# Patient Record
Sex: Male | Born: 2000 | Race: Black or African American | Hispanic: No | Marital: Single | Smoking: Never smoker
Health system: Southern US, Community
[De-identification: ages and names within clinical notes are randomized; demographics above are authoritative.]

## PROBLEM LIST (undated history)

## (undated) DIAGNOSIS — S7290XA Unspecified fracture of unspecified femur, initial encounter for closed fracture: Secondary | ICD-10-CM

## (undated) DIAGNOSIS — J352 Hypertrophy of adenoids: Secondary | ICD-10-CM

## (undated) HISTORY — DX: Hypertrophy of adenoids: J35.2

---
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
## 2019-03-26 ENCOUNTER — Other Ambulatory Visit: Payer: Self-pay

## 2019-03-26 ENCOUNTER — Emergency Department (HOSPITAL_COMMUNITY)
Admission: EM | Admit: 2019-03-26 | Discharge: 2019-03-27 | Discharge status: Against Medical Advice | Payer: Medicaid Other | Attending: Emergency Medicine | Admitting: Emergency Medicine

## 2019-03-26 ENCOUNTER — Encounter (HOSPITAL_COMMUNITY): Payer: Self-pay

## 2019-03-26 DIAGNOSIS — R109 Unspecified abdominal pain: Secondary | ICD-10-CM | POA: Diagnosis present

## 2019-03-26 DIAGNOSIS — Z5321 Procedure and treatment not carried out due to patient leaving prior to being seen by health care provider: Secondary | ICD-10-CM | POA: Diagnosis not present

## 2019-03-26 NOTE — ED Triage Notes (Signed)
Onset several hours after eating pt vomited x 1 with intermittant abd cramping.  No nausea.  120/79

## 2019-03-26 NOTE — ED Notes (Signed)
Per Albin Felling at [facility] pt no longer in waiting room

## 2019-03-26 NOTE — ED Notes (Signed)
NO answer for treatment room 

## 2019-03-26 NOTE — ED Notes (Signed)
No answer in waiting room 

## 2019-08-12 ENCOUNTER — Emergency Department (HOSPITAL_COMMUNITY): Payer: Medicaid Other

## 2019-08-12 ENCOUNTER — Emergency Department (HOSPITAL_COMMUNITY)
Admission: EM | Admit: 2019-08-12 | Discharge: 2019-08-12 | Disposition: A | Discharge status: Home / Self Care | Payer: Medicaid Other | Attending: Emergency Medicine | Admitting: Emergency Medicine

## 2019-08-12 ENCOUNTER — Other Ambulatory Visit: Payer: Self-pay

## 2019-08-12 ENCOUNTER — Encounter (HOSPITAL_COMMUNITY): Payer: Self-pay | Admitting: *Deleted

## 2019-08-12 DIAGNOSIS — R112 Nausea with vomiting, unspecified: Secondary | ICD-10-CM | POA: Diagnosis present

## 2019-08-12 DIAGNOSIS — R0789 Other chest pain: Secondary | ICD-10-CM | POA: Diagnosis not present

## 2019-08-12 DIAGNOSIS — R1013 Epigastric pain: Secondary | ICD-10-CM | POA: Diagnosis not present

## 2019-08-12 LAB — CBC
HCT: 46.7 % (ref 39.0–52.0)
Hemoglobin: 15.6 g/dL (ref 13.0–17.0)
MCH: 29.9 pg (ref 26.0–34.0)
MCHC: 33.4 g/dL (ref 30.0–36.0)
MCV: 89.5 fL (ref 80.0–100.0)
Platelets: 182 10*3/uL (ref 150–400)
RBC: 5.22 MIL/uL (ref 4.22–5.81)
RDW: 13 % (ref 11.5–15.5)
WBC: 4.2 10*3/uL (ref 4.0–10.5)
nRBC: 0 % (ref 0.0–0.2)

## 2019-08-12 LAB — TROPONIN I (HIGH SENSITIVITY)
Troponin I (High Sensitivity): <2 ng/L (ref <18)
Troponin I (High Sensitivity): <2 ng/L (ref <18)

## 2019-08-12 MED ORDER — ONDANSETRON 4 MG PO TBDP: 4.0000 mg | ORAL_TABLET | Freq: Three times a day (TID) | ORAL | 0 refills | Status: DC | PRN

## 2019-08-12 MED ORDER — ONDANSETRON 4 MG PO TBDP
4.0000 mg | ORAL_TABLET | Freq: Once | ORAL | Status: AC
  Administered 2019-08-12: 4 mg via ORAL
  Filled 2019-08-12: qty 1

## 2019-08-12 MED ORDER — ALUM & MAG HYDROXIDE-SIMETH 200-200-20 MG/5ML PO SUSP
30.0000 mL | Freq: Once | ORAL | Status: AC
  Administered 2019-08-12: 30 mL via ORAL
  Filled 2019-08-12: qty 30

## 2019-08-12 MED ORDER — IBUPROFEN 400 MG PO TABS: 400.0000 mg | ORAL_TABLET | Freq: Four times a day (QID) | ORAL | 0 refills | Status: DC | PRN

## 2019-08-12 MED ORDER — FAMOTIDINE 20 MG PO TABS: 20.0000 mg | ORAL_TABLET | Freq: Two times a day (BID) | ORAL | 0 refills | Status: DC

## 2019-08-12 NOTE — ED Provider Notes (Signed)
[facility] EMERGENCY DEPARTMENT Provider Note   CSN: [id number] Arrival date & time: 08/12/19  0042     History   Chief Complaint Chief Complaint  Patient presents with  . Chest Pain    HPI Douglas Chambers is a 18 y.o. male presenting to the emergency department with complaint of nausea and vomiting that began 3 days ago.  He states he is having trouble keeping food down with this odes of nausea and vomiting after meals.  He states after he began having nausea and vomiting he began having some chest discomfort that he describes as an ache across his anterior chest and epigastric abdomen.  He feels as though his chest symptoms are not better or worse with any position changes.  He has treated his symptoms with Pepto-Bismol with mild relief.  He denies urinary symptoms, fever, diarrhea, constipation, cough, shortness of breath.  No known sick contacts.     The history is provided by the patient.    Past Medical History:  Diagnosis Date  . Adenoid hypertrophy   . Femur fracture [facility])     Patient Active Problem List   Diagnosis Date Noted  . Dizziness 12/16/2018  . Acute otitis media of right ear in pediatric patient 09/07/2018  . Need for prophylactic vaccination and inoculation against influenza 09/07/2018  . High risk sexual behavior in adolescent 07/12/2018  . Acute torn meniscus of knee, right, initial encounter 09/10/2017  . Pain in right femur 09/08/2017  . Acute pain of right knee 09/08/2017  . Aggressive behavior of adolescent 03/15/2017  . BMI (body mass index), pediatric, 5% to less than 85% for age 83/06/2015  . Femur fracture ([place]) 12/08/2014  . Closed fracture of right femur ([place]) 12/08/2014  . Knee injury 08/01/2014  . Body mass index, pediatric, 5th percentile to less than 85th percentile for age 83/05/2014  . Acne vulgaris 06/15/2014  . Well child check 06/13/2013  . BMI (body mass index), pediatric, 85% to less than 95% for age 83/02/2012     Past Surgical History:  Procedure Laterality Date  . INTRAMEDULLARY (IM) NAIL INTERTROCHANTERIC Right 12/08/2014   Procedure: AFFIXUS NAIL/ FEMUR FX;  Surgeon: Marybelle Killings, MD;  Location: [facility];  Service: Orthopedics;  Laterality: Right;        Home Medications    Prior to Admission medications   Medication Sig Start Date End Date Taking? Authorizing Provider  acetaminophen (TYLENOL) 325 MG tablet Take 2 tablets (650 mg total) by mouth every 6 (six) hours as needed. 02/04/18   Jean Rosenthal, NP  azithromycin (ZITHROMAX) 500 MG tablet Take 2 tablets (1,000 mg total) by mouth daily. 07/12/18   Kristen Loader, DO  cetirizine (ZYRTEC) 10 MG tablet Take 1 tablet (10 mg total) by mouth daily. 09/07/18   Marcha Solders, MD  famotidine (PEPCID) 20 MG tablet Take 1 tablet (20 mg total) by mouth 2 (two) times daily. 08/12/19   Robinson, [place] N, PA-C  fluticasone (FLONASE) 50 MCG/ACT nasal spray Place 1 spray into both nostrils daily. Patient not taking: Reported on 03/14/2017 12/30/15   Hermenia Bers, NP  ibuprofen (ADVIL) 400 MG tablet Take 1 tablet (400 mg total) by mouth every 6 (six) hours as needed for mild pain or moderate pain. 08/12/19   Robinson, [place] N, PA-C  loperamide (IMODIUM) 2 MG capsule Take 1 capsule (2 mg total) by mouth 4 (four) times daily as needed for diarrhea or loose stools. Patient not taking: Reported  on 09/08/2017 06/17/17   McDonald, Pedro Earls A, PA-C  mupirocin ointment (BACTROBAN) 2 % Apply 1 application topically 2 (two) times daily. Patient not taking: Reported on 03/14/2017 05/18/16   Gretchen Short, NP  ondansetron (ZOFRAN ODT) 4 MG disintegrating tablet Take 1 tablet (4 mg total) by mouth every 8 (eight) hours as needed for nausea or vomiting. 08/12/19   Robinson, [place] N, PA-C  ondansetron (ZOFRAN) 4 MG tablet Take 1 tablet (4 mg total) by mouth 2 (two) times daily. Patient not taking: Reported on 03/14/2017 12/30/15   Gretchen Short, NP  polyethylene  glycol (MIRALAX / GLYCOLAX) packet Take 17 g by mouth daily. Patient not taking: Reported on 09/08/2017 06/09/17   Sherrilee Gilles, NP  polyethylene glycol [facility]) packet Take 17 g by mouth daily. Patient not taking: Reported on 03/14/2017 05/18/16   Gretchen Short, NP    Family History Family History  Problem Relation Age of Onset  . Hypertension Maternal Grandmother   . Diabetes Maternal Grandfather   . Hypertension Maternal Grandfather   . Alcohol abuse Neg Hx   . Arthritis Neg Hx   . Asthma Neg Hx   . Birth defects Neg Hx   . Cancer Neg Hx   . COPD Neg Hx   . Depression Neg Hx   . Drug abuse Neg Hx   . Hearing loss Neg Hx   . Early death Neg Hx   . Heart disease Neg Hx   . Hyperlipidemia Neg Hx   . Kidney disease Neg Hx   . Learning disabilities Neg Hx   . Mental illness Neg Hx   . Mental retardation Neg Hx   . Miscarriages / Stillbirths Neg Hx   . Stroke Neg Hx   . Vision loss Neg Hx     Social History Social History   Tobacco Use  . Smoking status: Never Smoker  . Smokeless tobacco: Never Used  Substance Use Topics  . Alcohol use: No  . Drug use: No     Allergies   Patient has no known allergies.   Review of Systems Review of Systems  Constitutional: Negative for fever.  Gastrointestinal: Positive for abdominal pain, nausea and vomiting.  All other systems reviewed and are negative.    Physical Exam Updated Vital Signs BP 133/75   Pulse 79   Temp 98.8 F (37.1 C) (Oral)   Resp 18   Ht 6' (1.829 m)   Wt 81.6 kg   SpO2 99%   BMI 24.41 kg/m   Physical Exam Vitals signs and nursing note reviewed.  Constitutional:      General: He is not in acute distress.    Appearance: He is well-developed. He is not ill-appearing.     Comments: Appears to be resting comfortably in bed, laying flat on his back.  HENT:     Head: Normocephalic and atraumatic.  Eyes:     Conjunctiva/sclera: Conjunctivae normal.  Cardiovascular:     Rate and Rhythm:  Normal rate and regular rhythm.  Pulmonary:     Effort: Pulmonary effort is normal. No respiratory distress.     Breath sounds: Normal breath sounds.  Chest:     Chest wall: Tenderness (Generalized anterior chest wall tenderness to palpation.) present.  Abdominal:     General: Bowel sounds are normal.     Palpations: Abdomen is soft.     Tenderness: There is abdominal tenderness in the epigastric area. There is no guarding or rebound.  Skin:  General: Skin is warm.  Neurological:     Mental Status: He is alert.  Psychiatric:        Behavior: Behavior normal.      ED Treatments / Results  Labs (all labs ordered are listed, but only abnormal results are displayed) Labs Reviewed  CBC  TROPONIN I (HIGH SENSITIVITY)  TROPONIN I (HIGH SENSITIVITY)    EKG EKG Interpretation  Date/Time:  Saturday August 12 2019 01:29:14 EDT Ventricular Rate:  58 PR Interval:  156 QRS Duration: 88 QT Interval:  400 QTC Calculation: 392 R Axis:   71 Text Interpretation:  Sinus bradycardia with sinus arrhythmia Early repolarization Otherwise normal ECG No previous ECGs available Confirmed by Zadie RhineWickline, Donald ([id number]) on 08/12/2019 1:35:57 AM   Radiology Dg Chest 2 View  Result Date: 08/12/2019 CLINICAL DATA:  Chest pain EXAM: CHEST - 2 VIEW COMPARISON:  12/08/2014 FINDINGS: The heart size and mediastinal contours are within normal limits. Both lungs are clear. The visualized skeletal structures are unremarkable. Metallic opacity over the right upper chest, no change IMPRESSION: No active cardiopulmonary disease. Electronically Signed   By: Jasmine PangKim  Fujinaga M.D.   On: 08/12/2019 02:20    Procedures Procedures (including critical care time)  Medications Ordered in ED Medications  ondansetron (ZOFRAN-ODT) disintegrating tablet 4 mg (4 mg Oral Given 08/12/19 [id number])  alum & mag hydroxide-simeth (MAALOX/MYLANTA) 200-200-20 MG/5ML suspension 30 mL (30 mLs Oral Given 08/12/19 [id number])     Initial  Impression / Assessment and Plan / ED Course  I have reviewed the triage vital signs and the nursing notes.  Pertinent labs & imaging results that were available during my care of the patient were reviewed by me and considered in my medical decision making (see chart for details).        Patient presenting with nausea and vomiting x3 days followed by anterior chest ache which is reproducible on exam.  Had labs done in triage which reveal normal white count, negative troponin, negative chest x-ray.Marland Kitchen.  He is in no acute distress, afebrile with stable vital signs.  Abdominal exam without peritoneal signs.  Low suspicion for emergent/surgical intra-abdominal pathology.   Treated symptomatically and patient is tolerating p.o. fluids. Chest pain is reproducible, likely musculoskeletal after episodes of emesis. Discussed EKG with Dr. Jeraldine LootsLockwood. Symptoms do not seem consistent with pericarditis, heart sounds normal, pt laying flat on his back in bed without distress. Will d/c with symptomatic management for N/V and instruct NSAIDs  For pain. Return for any worsening sx or if not improvement.   Discussed results, findings, treatment and follow up. Patient advised of return precautions. Patient verbalized understanding and agreed with plan.  Final Clinical Impressions(s) / ED Diagnoses   Final diagnoses:  Non-intractable vomiting with nausea, unspecified vomiting type  Chest wall pain    ED Discharge Orders         Ordered    ondansetron (ZOFRAN ODT) 4 MG disintegrating tablet  Every 8 hours PRN     08/12/19 0932    famotidine (PEPCID) 20 MG tablet  2 times daily     08/12/19 0932    ibuprofen (ADVIL) 400 MG tablet  Every 6 hours PRN     08/12/19 0932           Robinson, [place] N, PA-C 08/12/19 [id number]    Gerhard MunchLockwood, Robert, MD 08/13/19 [phone]

## 2019-08-12 NOTE — ED Triage Notes (Signed)
The ptis c/o chest pain vomiting for one week no temp   No distress

## 2019-08-12 NOTE — Discharge Instructions (Signed)
Please read instructions below. Drink clear liquids until your stomach feels better. Then, slowly introduce bland foods into your diet as tolerated, such as bread, rice, apples, bananas. You can take zofran every 8 hours as needed for nausea. You can take pepcid every 12 hours to help with acid in your stomach. Take ibuprofen every 6 hours for pain for at least the next 48 hours. Follow up with your primary care if symptoms persist. Return to the ER if no improvement, severe abdominal pain, fever, uncontrollable vomiting, or new or concerning symptoms.

## 2019-08-12 NOTE — ED Notes (Signed)
Pt given ginger ale.

## 2019-08-15 ENCOUNTER — Telehealth: Payer: Medicaid Other | Admitting: Nurse Practitioner

## 2019-08-15 DIAGNOSIS — R112 Nausea with vomiting, unspecified: Secondary | ICD-10-CM

## 2019-08-15 NOTE — Progress Notes (Signed)
Based on what you shared with me it looks like you have nausea and vomiting,that should be evaluated in a face to face office visit. According to your chart you went vo the ER on 08/12/19 with the same complaint and was given zofran. Because this has not resolved since then you will need further testing doine which I cannot do in an evisit. You will need to see your PCP or go to the urgent care.    NOTE: If you entered your credit card information for this eVisit, you will not be charged. You may see a "hold" on your card for the $35 but that hold will drop off and you will not have a charge processed.  If you are having a true medical emergency please call 911.     For an urgent face to face visit, [facility] has four urgent care centers for your convenience:   . [facility]    [phone]                  Get Driving Directions  [address], [place] [address] . 10 am to 8 pm Monday-Friday . 12 pm to 8 pm Saturday-Sunday   . [facility] [place]                  Get Driving Directions  [address], [place], [place] [address] . 8 am to 8 pm Monday-Friday . 9 am to 6 pm Saturday . 11 am to 6 pm Sunday   . [facility] at [place]                  Get Driving Directions   [address] [place], [place] [address] . 8 am to 8 pm Monday-Friday . 8 am to 4 pm Saturday-Sunday    . [facility] [place]                    Get Driving Directions  [phone]  [address], [place], [place] [address]  . Monday-Friday, 12 PM to 6 PM    Your e-visit answers were reviewed by a board certified advanced clinical practitioner to complete your personal care plan.  Thank you for using e-Visits.

## 2020-01-01 MED ORDER — ONDANSETRON 4 MG PO TBDP
4.00 | ORAL_TABLET | ORAL | Status: DC
Start: ? — End: 2020-01-01

## 2020-01-01 MED ORDER — LORAZEPAM 1 MG PO TABS
1.00 | ORAL_TABLET | ORAL | Status: DC
Start: ? — End: 2020-01-01

## 2020-01-01 MED ORDER — DEXTROSE IN LACTATED RINGERS 5 % IV SOLN
INTRAVENOUS | Status: DC
Start: ? — End: 2020-01-01

## 2020-01-01 MED ORDER — ALUM & MAG HYDROXIDE-SIMETH 200-200-20 MG/5ML PO SUSP
30.00 | ORAL | Status: DC
Start: ? — End: 2020-01-01

## 2020-02-13 ENCOUNTER — Ambulatory Visit (INDEPENDENT_AMBULATORY_CARE_PROVIDER_SITE_OTHER): Payer: Medicaid Other | Admitting: Pediatrics

## 2020-02-13 ENCOUNTER — Other Ambulatory Visit: Payer: Self-pay

## 2020-02-13 VITALS — Wt 170.7 lb

## 2020-02-13 DIAGNOSIS — B349 Viral infection, unspecified: Secondary | ICD-10-CM | POA: Diagnosis not present

## 2020-02-13 DIAGNOSIS — R52 Pain, unspecified: Secondary | ICD-10-CM | POA: Diagnosis not present

## 2020-02-13 NOTE — Progress Notes (Signed)
Subjective:    Douglas Chambers is a 19 y.o. old male here with his self for Fatigue and Emesis   HPI: Douglas Chambers presents with history of 3 days ago with vomiting and nausea. The first day it was x4 vomiting.  Haven't had any vomiting yesterday.  Started having some back pain, body aches and low energy.  Denies any fevers, diff breathing, wheezing, chills, loss taste and smell, HA, sore throat.  Today feeling a little better but still some body aches.  He is keeping fluids and some food down.     The following portions of the patient's history were reviewed and updated as appropriate: allergies, current medications, past family history, past medical history, past social history, past surgical history and problem list.  Review of Systems Pertinent items are noted in HPI.   Allergies: No Known Allergies   Current Outpatient Medications on File Prior to Visit  Medication Sig Dispense Refill  . acetaminophen (TYLENOL) 325 MG tablet Take 2 tablets (650 mg total) by mouth every 6 (six) hours as needed. 30 tablet 0  . azithromycin (ZITHROMAX) 500 MG tablet Take 2 tablets (1,000 mg total) by mouth daily. 2 tablet 0  . cetirizine (ZYRTEC) 10 MG tablet Take 1 tablet (10 mg total) by mouth daily. 30 tablet 2  . famotidine (PEPCID) 20 MG tablet Take 1 tablet (20 mg total) by mouth 2 (two) times daily. 20 tablet 0  . fluticasone (FLONASE) 50 MCG/ACT nasal spray Place 1 spray into both nostrils daily. (Patient not taking: Reported on 03/14/2017) 16 g 12  . ibuprofen (ADVIL) 400 MG tablet Take 1 tablet (400 mg total) by mouth every 6 (six) hours as needed for mild pain or moderate pain. 20 tablet 0  . loperamide (IMODIUM) 2 MG capsule Take 1 capsule (2 mg total) by mouth 4 (four) times daily as needed for diarrhea or loose stools. (Patient not taking: Reported on 09/08/2017) 12 capsule 0  . mupirocin ointment (BACTROBAN) 2 % Apply 1 application topically 2 (two) times daily. (Patient not taking: Reported on 03/14/2017)  22 g 0  . ondansetron (ZOFRAN ODT) 4 MG disintegrating tablet Take 1 tablet (4 mg total) by mouth every 8 (eight) hours as needed for nausea or vomiting. 20 tablet 0  . ondansetron (ZOFRAN) 4 MG tablet Take 1 tablet (4 mg total) by mouth 2 (two) times daily. (Patient not taking: Reported on 03/14/2017) 8 tablet 0  . polyethylene glycol (MIRALAX / GLYCOLAX) packet Take 17 g by mouth daily. (Patient not taking: Reported on 09/08/2017) 30 each 1  . polyethylene glycol (MIRALAX) packet Take 17 g by mouth daily. (Patient not taking: Reported on 03/14/2017) 14 each 0   No current facility-administered medications on file prior to visit.    History and Problem List: Past Medical History:  Diagnosis Date  . Adenoid hypertrophy   . Femur fracture (HCC)         Objective:    Wt 170 lb 11.2 oz (77.4 kg)   BMI 23.15 kg/m   General: alert, active, cooperative, non toxic ENT: oropharynx moist, no lesions, nares no discharge Eye:  PERRL, EOMI, conjunctivae clear, no discharge Ears: TM clear/intact bilateral, no discharge Neck: supple, no sig LAD Lungs: clear to auscultation, no wheeze, crackles or retractions Heart: RRR, Nl S1, S2, no murmurs Abd: soft, non tender, non distended, normal BS, no organomegaly, no masses appreciated, no guarding or rebound tenderness Skin: no rashes Neuro: normal mental status, No focal deficits  No results found  for this or any previous visit (from the past 41 hour(s)).     Assessment:   Douglas Chambers is a 19 y.o. old male with  1. Acute viral syndrome   2. Generalized body aches     Plan:   1.   --Normal progression of viral illness discussed. All questions answered.  --Avoid smoke exposure which can exacerbate and lengthened symptoms.  --Instruction given for use of nasal saline, cough drops and OTC's for symptomatic relief --Explained the rationale for symptomatic treatment rather than use of an antibiotic. --Rest and fluids  encouraged --Analgesics/Antipyretics as needed, dose reviewed. --Discuss worrisome symptoms to monitor for that would require evaluation. --Follow up as needed should symptoms fail to improve. --offered Covid test but declined, discussed could not rule out.   --Discussed possibility of symptoms being Covid19.  Given information for getting tested but results would not change quarantine recommendation.  Due to the symptoms being indistinguishable from other viral illness.  Recommendations are to quarantine for 10 days after symptom onset or positive test and at least 24hrs without fever or 10 days after Covid 19 positive exposure or 7 days after exposure with negative Covid19 test.  Discussed concerning symptoms that would need immediate evaluation like shortness of breath, chest pain or persistent symptoms.       No orders of the defined types were placed in this encounter.    Return if symptoms worsen or fail to improve. in 2-3 days or prior for concerns  Kristen Loader, DO

## 2020-02-16 ENCOUNTER — Encounter: Payer: Self-pay | Admitting: Pediatrics

## 2020-02-16 NOTE — Patient Instructions (Addendum)
Viral Illness, Adult Viruses are tiny germs that can get into a person's body and cause illness. There are many different types of viruses, and they cause many types of illness. Viral illnesses can range from mild to severe. They can affect various parts of the body. Common illnesses that are caused by a virus include colds and the flu. Viral illnesses also include serious conditions such as HIV/AIDS (human immunodeficiency virus/acquired immunodeficiency syndrome). A few viruses have been linked to certain cancers. What are the causes? Many types of viruses can cause illness. Viruses invade cells in your body, multiply, and cause the infected cells to malfunction or die. When the cell dies, it releases more of the virus. When this happens, you develop symptoms of the illness, and the virus continues to spread to other cells. If the virus takes over the function of the cell, it can cause the cell to divide and grow out of control, as is the case when a virus causes cancer. Different viruses get into the body in different ways. You can get a virus by:  Swallowing food or water that is contaminated with the virus.  Breathing in droplets that have been coughed or sneezed into the air by an infected person.  Touching a surface that has been contaminated with the virus and then touching your eyes, nose, or mouth.  Being bitten by an insect or animal that carries the virus.  Having sexual contact with a person who is infected with the virus.  Being exposed to blood or fluids that contain the virus, either through an open cut or during a transfusion. If a virus enters your body, your body's defense system (immune system) will try to fight the virus. You may be at higher risk for a viral illness if your immune system is weak. What are the signs or symptoms? Symptoms vary depending on the type of virus and the location of the cells that it invades. Common symptoms of the main types of viral illnesses  include: Cold and flu viruses  Fever.  Headache.  Sore throat.  Muscle aches.  Nasal congestion.  Cough. Digestive system (gastrointestinal) viruses  Fever.  Abdominal pain.  Nausea.  Diarrhea. Liver viruses (hepatitis)  Loss of appetite.  Tiredness.  Yellowing of the skin (jaundice). Brain and spinal cord viruses  Fever.  Headache.  Stiff neck.  Nausea and vomiting.  Confusion or sleepiness. Skin viruses  Warts.  Itching.  Rash. Sexually transmitted viruses  Discharge.  Swelling.  Redness.  Rash. How is this treated? Viruses can be difficult to treat because they live within cells. Antibiotic medicines do not treat viruses because these drugs do not get inside cells. Treatment for a viral illness may include:  Resting and drinking plenty of fluids.  Medicines to relieve symptoms. These can include over-the-counter medicine for pain and fever, medicines for cough or congestion, and medicines to relieve diarrhea.  Antiviral medicines. These drugs are available only for certain types of viruses. They may help reduce flu symptoms if taken early. There are also many antiviral medicines for hepatitis and HIV/AIDS. Some viral illnesses can be prevented with vaccinations. A common example is the flu shot. Follow these instructions at home: Medicines   Take over-the-counter and prescription medicines only as told by your health care provider.  If you were prescribed an antiviral medicine, take it as told by your health care provider. Do not stop taking the medicine even if you start to feel better.  Be aware of when   antibiotics are needed and when they are not needed. Antibiotics do not treat viruses. If your health care provider thinks that you may have a bacterial infection as well as a viral infection, you may get an antibiotic. ? Do not ask for an antibiotic prescription if you have been diagnosed with a viral illness. That will not make your  illness go away faster. ? Frequently taking antibiotics when they are not needed can lead to antibiotic resistance. When this develops, the medicine no longer works against the bacteria that it normally fights. General instructions  Drink enough fluids to keep your urine clear or pale yellow.  Rest as much as possible.  Return to your normal activities as told by your health care provider. Ask your health care provider what activities are safe for you.  Keep all follow-up visits as told by your health care provider. This is important. How is this prevented? Take these actions to reduce your risk of viral infection:  Eat a healthy diet and get enough rest.  Wash your hands often with soap and water. This is especially important when you are in public places. If soap and water are not available, use hand sanitizer.  Avoid close contact with friends and family who have a viral illness.  If you travel to areas where viral gastrointestinal infection is common, avoid drinking water or eating raw food.  Keep your immunizations up to date. Get a flu shot every year as told by your health care provider.  Do not share toothbrushes, nail clippers, razors, or needles with other people.  Always practice safe sex.  Contact a health care provider if:  You have symptoms of a viral illness that do not go away.  Your symptoms come back after going away.  Your symptoms get worse. Get help right away if:  You have trouble breathing.  You have a severe headache or a stiff neck.  You have severe vomiting or abdominal pain. This information is not intended to replace advice given to you by your health care provider. Make sure you discuss any questions you have with your health care provider. Document Revised: 10/08/2017 Document Reviewed: 03/06/2016 Elsevier Patient Education  2020 Else[employer] Inc.  

## 2020-02-19 ENCOUNTER — Ambulatory Visit: Payer: Medicaid Other

## 2020-02-20 ENCOUNTER — Other Ambulatory Visit: Payer: Self-pay

## 2020-02-20 ENCOUNTER — Ambulatory Visit (INDEPENDENT_AMBULATORY_CARE_PROVIDER_SITE_OTHER): Payer: Medicaid Other | Admitting: Pediatrics

## 2020-02-20 ENCOUNTER — Encounter: Payer: Self-pay | Admitting: Pediatrics

## 2020-02-20 VITALS — Temp 98.3°F | Wt 168.2 lb

## 2020-02-20 DIAGNOSIS — R112 Nausea with vomiting, unspecified: Secondary | ICD-10-CM | POA: Diagnosis not present

## 2020-02-20 DIAGNOSIS — R1084 Generalized abdominal pain: Secondary | ICD-10-CM | POA: Diagnosis not present

## 2020-02-20 MED ORDER — ONDANSETRON 4 MG PO TBDP: 4.0000 mg | ORAL_TABLET | Freq: Three times a day (TID) | ORAL | 0 refills | Status: DC | PRN

## 2020-02-20 MED ORDER — FAMOTIDINE 20 MG PO TABS: 20.0000 mg | ORAL_TABLET | Freq: Two times a day (BID) | ORAL | 0 refills | Status: AC

## 2020-02-20 NOTE — Patient Instructions (Signed)
Go to [email or web address] to sign up for appointment to have Covid tested.

## 2020-02-20 NOTE — Progress Notes (Signed)
Subjective:    Douglas Chambers is a 19 y.o. old male here with his self for Abdominal Pain and Emesis   HPI: Douglas Chambers presents with history of seen in office last week 4/6 with h/o nausea and vomiting for a couple days.  Was having some back pain and body aches and decreased energy.  He was seen in ER in February for similar symptoms.  Reported the N/V had went away and body aches.  Then stomach pain started 2 days ago and felt nauseous and vomited.  He layed down and vomited 1x more that afternoon and just layed down.  Was able to keep down some broth.  Yesterday was a little better w/o vomiting and feeling tired and just layed around.  He reports pain is on sides of stomach comes and goes and sharp feeling.  Denies any fevers, chills, cough, runny nose and congestion, ear pain, breathing issues, wheezing, diarrhea, body aches, constipation.   Reports that last time in 10/21 seen for abd pain and n/v tried pepcid and it was helpful.  Pain was similar back in ER visist in 2/21 and 10/20.     The following portions of the patient's history were reviewed and updated as appropriate: allergies, current medications, past family history, past medical history, past social history, past surgical history and problem list.  Review of Systems Pertinent items are noted in HPI.   Allergies: No Known Allergies  ++0 Current Outpatient Medications on File Prior to Visit  Medication Sig Dispense Refill  . acetaminophen (TYLENOL) 325 MG tablet Take 2 tablets (650 mg total) by mouth every 6 (six) hours as needed. 30 tablet 0  . azithromycin (ZITHROMAX) 500 MG tablet Take 2 tablets (1,000 mg total) by mouth daily. 2 tablet 0  . cetirizine (ZYRTEC) 10 MG tablet Take 1 tablet (10 mg total) by mouth daily. 30 tablet 2  . fluticasone (FLONASE) 50 MCG/ACT nasal spray Place 1 spray into both nostrils daily. (Patient not taking: Reported on 03/14/2017) 16 g 12   No current facility-administered medications on file prior to visit.     History and Problem List: Past Medical History:  Diagnosis Date  . Adenoid hypertrophy   . Femur fracture (HCC)         Objective:    Temp 98.3 F (36.8 C)   Wt 168 lb 4 oz (76.3 kg)   BMI 22.82 kg/m   General: alert, active, cooperative, non toxic ENT: oropharynx moist, OP clearno lesions, nares no discharge Eye:  PERRL, EOMI, conjunctivae clear, no discharge Ears: TM clear/intact bilateral, no discharge Neck: supple, no sig LAD Lungs: clear to auscultation, no wheeze, crackles or retractions Heart: RRR, Nl S1, S2, no murmurs Abd: soft, tender to deep palpation across abdomen and more bilateral mid abdomen, no rebound tenderness, no pain with percussion/foot slap, non distended, normal BS, no organomegaly, no masses appreciated Skin: no rashes Neuro: normal mental status, No focal deficits  No results found for this or any previous visit (from the past 72 hour(s)).     Assessment:   Douglas Chambers is a 19 y.o. old male with  1. Generalized abdominal pain   2. Non-intractable vomiting with nausea, unspecified vomiting type     Plan:+   1.  Offered Covid test in office but discussed PCR test may be better option further out from onset of symptoms and patient would rather just take one test and will make appointment.  Plan to get Covid PCR tested tomorrow and will sign up online  at [email or web address] to get tested.  Zofran for n/v q8hr.  Refill Pepcid which helped last episode.  Will refer to GI for his chronic history of recurrent abdominal pain, and n/v.  He has had multiple episodes of abdominal pain and nausea/vomiting with many ER visits in past 6 months.     Meds ordered this encounter  Medications  . ondansetron (ZOFRAN ODT) 4 MG disintegrating tablet    Sig: Take 1 tablet (4 mg total) by mouth every 8 (eight) hours as needed for nausea or vomiting.    Dispense:  20 tablet    Refill:  0  . famotidine (PEPCID) 20 MG tablet    Sig: Take 1 tablet (20 mg total)  by mouth 2 (two) times daily for 14 days.    Dispense:  30 tablet    Refill:  0     Return if symptoms worsen or fail to improve. in 2-3 days or prior for concerns  Kristen Loader, DO

## 2020-02-23 ENCOUNTER — Encounter: Payer: Self-pay | Admitting: Pediatrics

## 2020-02-23 NOTE — Addendum Note (Signed)
Addended by: Estevan Ryder on: 02/23/2020 09:36 AM   Modules accepted: Orders

## 2020-02-28 ENCOUNTER — Other Ambulatory Visit: Payer: Self-pay

## 2020-02-28 ENCOUNTER — Encounter (HOSPITAL_COMMUNITY): Payer: Self-pay

## 2020-02-28 ENCOUNTER — Ambulatory Visit (HOSPITAL_COMMUNITY)
Admission: EM | Admit: 2020-02-28 | Discharge: 2020-02-28 | Disposition: A | Discharge status: Home / Self Care | Payer: Medicaid Other | Attending: Internal Medicine | Admitting: Internal Medicine

## 2020-02-28 DIAGNOSIS — Z20822 Contact with and (suspected) exposure to covid-19: Secondary | ICD-10-CM | POA: Diagnosis not present

## 2020-02-28 NOTE — Discharge Instructions (Addendum)
Monitor MyChart for results °

## 2020-02-28 NOTE — ED Provider Notes (Signed)
[facility]    CSN: [id number] Arrival date & time: 02/28/20  1443      History   Chief Complaint Chief Complaint  Patient presents with  . Covid Exposure    HPI Douglas Chambers is a 19 y.o. male a significant past medical history presenting today for evaluation of Covid testing.  Patient had possible exposure at work and work is requiring testing prior to returning.  He denies any symptoms, denies any URI symptoms, GI symptoms.  Denies fevers chills or body aches.  HPI  Past Medical History:  Diagnosis Date  . Adenoid hypertrophy   . Femur fracture [facility])     Patient Active Problem List   Diagnosis Date Noted  . Dizziness 12/16/2018  . Acute otitis media of right ear in pediatric patient 09/07/2018  . Need for prophylactic vaccination and inoculation against influenza 09/07/2018  . High risk sexual behavior in adolescent 07/12/2018  . Acute torn meniscus of knee, right, initial encounter 09/10/2017  . Pain in right femur 09/08/2017  . Acute pain of right knee 09/08/2017  . Aggressive behavior of adolescent 03/15/2017  . BMI (body mass index), pediatric, 5% to less than 85% for age 77/06/2015  . Femur fracture ([facility]) 12/08/2014  . Closed fracture of right femur ([facility]) 12/08/2014  . Knee injury 08/01/2014  . Body mass index, pediatric, 5th percentile to less than 85th percentile for age 77/05/2014  . Acne vulgaris 06/15/2014  . Well child check 06/13/2013  . BMI (body mass index), pediatric, 85% to less than 95% for age 77/02/2012    Past Surgical History:  Procedure Laterality Date  . INTRAMEDULLARY (IM) NAIL INTERTROCHANTERIC Right 12/08/2014   Procedure: AFFIXUS NAIL/ FEMUR FX;  Surgeon: Marybelle Killings, MD;  Location: [facility];  Service: Orthopedics;  Laterality: Right;       Home Medications    Prior to Admission medications   Medication Sig Start Date End Date Taking? Authorizing Provider  acetaminophen (TYLENOL) 325 MG tablet Take 2 tablets (650 mg  total) by mouth every 6 (six) hours as needed. 02/04/18   Jean Rosenthal, NP  azithromycin (ZITHROMAX) 500 MG tablet Take 2 tablets (1,000 mg total) by mouth daily. 07/12/18   Kristen Loader, DO  cetirizine (ZYRTEC) 10 MG tablet Take 1 tablet (10 mg total) by mouth daily. 09/07/18   Marcha Solders, MD  famotidine (PEPCID) 20 MG tablet Take 1 tablet (20 mg total) by mouth 2 (two) times daily for 14 days. 02/20/20 03/05/20  Kristen Loader, DO  fluticasone (FLONASE) 50 MCG/ACT nasal spray Place 1 spray into both nostrils daily. Patient not taking: Reported on 03/14/2017 12/30/15   Hermenia Bers, NP  ondansetron (ZOFRAN ODT) 4 MG disintegrating tablet Take 1 tablet (4 mg total) by mouth every 8 (eight) hours as needed for nausea or vomiting. 02/20/20   Kristen Loader, DO    Family History Family History  Problem Relation Age of Onset  . Hypertension Maternal Grandmother   . Diabetes Maternal Grandfather   . Hypertension Maternal Grandfather   . Alcohol abuse Neg Hx   . Arthritis Neg Hx   . Asthma Neg Hx   . Birth defects Neg Hx   . Cancer Neg Hx   . COPD Neg Hx   . Depression Neg Hx   . Drug abuse Neg Hx   . Hearing loss Neg Hx   . Early death Neg Hx   . Heart disease Neg Hx   .  Hyperlipidemia Neg Hx   . Kidney disease Neg Hx   . Learning disabilities Neg Hx   . Mental illness Neg Hx   . Mental retardation Neg Hx   . Miscarriages / Stillbirths Neg Hx   . Stroke Neg Hx   . Vision loss Neg Hx     Social History Social History   Tobacco Use  . Smoking status: Never Smoker  . Smokeless tobacco: Never Used  Substance Use Topics  . Alcohol use: No  . Drug use: No     Allergies   Patient has no known allergies.   Review of Systems Review of Systems  Constitutional: Negative for activity change, appetite change, chills, fatigue and fever.  HENT: Negative for congestion, ear pain, rhinorrhea, sinus pressure, sore throat and trouble swallowing.   Eyes:  Negative for discharge and redness.  Respiratory: Negative for cough, chest tightness and shortness of breath.   Cardiovascular: Negative for chest pain.  Gastrointestinal: Negative for abdominal pain, diarrhea, nausea and vomiting.  Musculoskeletal: Negative for myalgias.  Skin: Negative for rash.  Neurological: Negative for dizziness, light-headedness and headaches.     Physical Exam Triage Vital Signs ED Triage Vitals  Enc Vitals Group     BP 02/28/20 1509 132/77     Pulse Rate 02/28/20 1509 78     Resp 02/28/20 1509 16     Temp 02/28/20 1509 98.9 F (37.2 C)     Temp Source 02/28/20 1509 Oral     SpO2 02/28/20 1509 100 %     Weight --      Height --      Head Circumference --      Peak Flow --      Pain Score 02/28/20 1507 0     Pain Loc --      Pain Edu? --      Excl. in GC? --    No data found.  Updated Vital Signs BP 132/77 (BP Location: Left Arm)   Pulse 78   Temp 98.9 F (37.2 C) (Oral)   Resp 16   SpO2 100%   Visual Acuity Right Eye Distance:   Left Eye Distance:   Bilateral Distance:    Right Eye Near:   Left Eye Near:    Bilateral Near:     Physical Exam Vitals and nursing note reviewed.  Constitutional:      Appearance: He is well-developed.     Comments: No acute distress  HENT:     Head: Normocephalic and atraumatic.     Nose: Nose normal.  Eyes:     Conjunctiva/sclera: Conjunctivae normal.  Cardiovascular:     Rate and Rhythm: Normal rate.  Pulmonary:     Effort: Pulmonary effort is normal. No respiratory distress.     Comments: Breathing comfortably at rest, CTABL, no wheezing, rales or other adventitious sounds auscultated Abdominal:     General: There is no distension.  Musculoskeletal:        General: Normal range of motion.     Cervical back: Neck supple.  Skin:    General: Skin is warm and dry.  Neurological:     Mental Status: He is alert and oriented to person, place, and time.      UC Treatments / Results   Labs (all labs ordered are listed, but only abnormal results are displayed) Labs Reviewed  SARS CORONAVIRUS 2 (TAT 6-24 HRS)    EKG   Radiology No results found.  Procedures Procedures (including critical  care time)  Medications Ordered in UC Medications - No data to display  Initial Impression / Assessment and Plan / UC Course  I have reviewed the triage vital signs and the nursing notes.  Pertinent labs & imaging results that were available during my care of the patient were reviewed by me and considered in my medical decision making (see chart for details).     Covid PCR pending.  Currently asymptomatic.  Follow-up if developing any symptoms.  Discussed strict return precautions. Patient verbalized understanding and is agreeable with plan.  Final Clinical Impressions(s) / UC Diagnoses   Final diagnoses:  Encounter for laboratory testing for COVID-19 virus  Exposure to COVID-19 virus     Discharge Instructions     Monitor MyChart for results   ED Prescriptions    None     PDMP not reviewed this encounter.   Lew Dawes, [place] 02/28/20 1528

## 2020-02-28 NOTE — ED Triage Notes (Signed)
Pt presents to UC for COVID testing after exposure. Pt states someone at work tested positive for COVID this week and he need a COVID test to return to work. Pt denies any signs and symptoms.

## 2020-02-29 LAB — SARS CORONAVIRUS 2 (TAT 6-24 HRS): SARS Coronavirus 2: NEGATIVE

## 2020-03-11 ENCOUNTER — Ambulatory Visit: Payer: Medicaid Other | Admitting: Pediatrics

## 2021-01-06 ENCOUNTER — Telehealth: Payer: Self-pay | Admitting: *Deleted

## 2021-01-06 NOTE — Telephone Encounter (Signed)
Transition Care Management Unsuccessful Follow-up Telephone Call  Date of discharge and from where:  01/03/2021 - [facility]  Attempts:  1st Attempt  Reason for unsuccessful TCM follow-up call:  Left voice message

## 2021-01-07 NOTE — Telephone Encounter (Signed)
Transition Care Management Follow-up Telephone Call  Date of discharge and from where: 01/03/2021 - [facility]  How have you been since you were released from the hospital? "Feeling better"  Any questions or concerns? No  Items Reviewed:  Did the pt receive and understand the discharge instructions provided? Yes   Medications obtained and verified? Yes   Other? No   Any new allergies since your discharge? No   Dietary orders reviewed? Yes  Do you have support at home? Yes   Home Care and Equipment/Supplies: Were home health services ordered? not applicable If so, what is the name of the agency? [name]  Has the agency set up a time to come to the patient's home? not applicable Were any new equipment or medical supplies ordered?  No What is the name of the medical supply agency? [name] Were you able to get the supplies/equipment? not applicable Do you have any questions related to the use of the equipment or supplies? No  Functional Questionnaire: (I = Independent and D = Dependent) ADLs: I  Bathing/Dressing- I  Meal Prep- I  Eating- I  Maintaining continence- I  Transferring/Ambulation- I  Managing Meds- I  Follow up appointments reviewed:   PCP Hospital f/u appt confirmed? No    Specialist Hospital f/u appt confirmed? No    Are transportation arrangements needed? No   If their condition worsens, is the pt aware to call PCP or go to the Emergency Dept.? Yes  Was the patient provided with contact information for the PCP's office or ED? Yes  Was to pt encouraged to call back with questions or concerns? Yes

## 2023-11-04 ENCOUNTER — Encounter (HOSPITAL_COMMUNITY): Payer: Self-pay | Admitting: Emergency Medicine

## 2023-11-04 ENCOUNTER — Ambulatory Visit (HOSPITAL_COMMUNITY)
Admission: EM | Admit: 2023-11-04 | Discharge: 2023-11-04 | Disposition: A | Discharge status: Home / Self Care | Payer: MEDICAID | Attending: Nurse Practitioner | Admitting: Nurse Practitioner

## 2023-11-04 DIAGNOSIS — L0591 Pilonidal cyst without abscess: Secondary | ICD-10-CM | POA: Diagnosis not present

## 2023-11-04 MED ORDER — DOXYCYCLINE HYCLATE 100 MG PO CAPS: 100.0000 mg | ORAL_CAPSULE | Freq: Two times a day (BID) | ORAL | 0 refills | Status: AC

## 2023-11-04 NOTE — Discharge Instructions (Addendum)
Take the doxycycline as prescribed to treat infection under your skin.  Seek care if symptoms worsen despite treatment and if you develop an abscess so we can drain it.  The meantime, start warm compresses and continue over-the-counter oral analgesics help with pain.

## 2023-11-04 NOTE — ED Provider Notes (Signed)
[facility]    CSN: [id number] Arrival date & time: 11/04/23  1140      History   Chief Complaint Chief Complaint  Patient presents with   Tailbone Pain    HPI Douglas Chambers is a 22 y.o. male.   Patient presents today with pain to buttocks ongoing for the past week.  Denies recent fall, trauma, or known injury to the area.  No recent pulling or pushing anything heavy or falling and landing on his tailbone.  Has been taking over-the-counter BC powders, muscle laxer, and ibuprofen for pain without much improvement.  Denies swelling, bruising, redness, or drainage from the area.  Denies history of similar.    Past Medical History:  Diagnosis Date   Adenoid hypertrophy    Femur fracture [facility])     Patient Active Problem List   Diagnosis Date Noted   Dizziness 12/16/2018   Acute otitis media of right ear in pediatric patient 09/07/2018   Need for prophylactic vaccination and inoculation against influenza 09/07/2018   High risk sexual behavior in adolescent 07/12/2018   Acute torn meniscus of knee, right, initial encounter 09/10/2017   Pain in right femur 09/08/2017   Acute pain of right knee 09/08/2017   Aggressive behavior of adolescent 03/15/2017   BMI (body mass index), pediatric, 5% to less than 85% for age 55/06/2015   Femur fracture (HCC) 12/08/2014   Closed fracture of right femur (HCC) 12/08/2014   Knee injury 08/01/2014   Body mass index, pediatric, 5th percentile to less than 85th percentile for age 55/05/2014   Acne vulgaris 06/15/2014   Well child check 06/13/2013   BMI (body mass index), pediatric, 85% to less than 95% for age 55/02/2012    Past Surgical History:  Procedure Laterality Date   INTRAMEDULLARY (IM) NAIL INTERTROCHANTERIC Right 12/08/2014   Procedure: AFFIXUS NAIL/ FEMUR FX;  Surgeon: Eldred Manges, MD;  Location: [facility] OR;  Service: Orthopedics;  Laterality: Right;       Home Medications    Prior to Admission medications    Medication Sig Start Date End Date Taking? Authorizing Provider  doxycycline (VIBRAMYCIN) 100 MG capsule Take 1 capsule (100 mg total) by mouth 2 (two) times daily for 7 days. 11/04/23 11/11/23 Yes Valentino Nose, NP  famotidine (PEPCID) 20 MG tablet Take 1 tablet (20 mg total) by mouth 2 (two) times daily for 14 days. 02/20/20 03/05/20  Myles Gip, DO    Family History Family History  Problem Relation Age of Onset   Hypertension Maternal Grandmother    Diabetes Maternal Grandfather    Hypertension Maternal Grandfather    Alcohol abuse Neg Hx    Arthritis Neg Hx    Asthma Neg Hx    Birth defects Neg Hx    Cancer Neg Hx    COPD Neg Hx    Depression Neg Hx    Drug abuse Neg Hx    Hearing loss Neg Hx    Early death Neg Hx    Heart disease Neg Hx    Hyperlipidemia Neg Hx    Kidney disease Neg Hx    Learning disabilities Neg Hx    Mental illness Neg Hx    Mental retardation Neg Hx    Miscarriages / Stillbirths Neg Hx    Stroke Neg Hx    Vision loss Neg Hx     Social History Social History   Tobacco Use   Smoking status: Never   Smokeless tobacco: Never  Substance Use Topics   Alcohol use: No   Drug use: No     Allergies   Patient has no known allergies.   Review of Systems Review of Systems Per HPI  Physical Exam Triage Vital Signs ED Triage Vitals [11/04/23 1219]  Encounter Vitals Group     BP 128/88     Systolic BP Percentile      Diastolic BP Percentile      Pulse Rate 82     Resp 16     Temp 98.4 F (36.9 C)     Temp Source Oral     SpO2 98 %     Weight      Height      Head Circumference      Peak Flow      Pain Score 5     Pain Loc      Pain Education      Exclude from Growth Chart    No data found.  Updated Vital Signs BP 128/88 (BP Location: Left Arm)   Pulse 82   Temp 98.4 F (36.9 C) (Oral)   Resp 16   SpO2 98%   Visual Acuity Right Eye Distance:   Left Eye Distance:   Bilateral Distance:    Right Eye Near:    Left Eye Near:    Bilateral Near:     Physical Exam Vitals and nursing note reviewed.  Constitutional:      General: He is not in acute distress.    Appearance: Normal appearance. He is not toxic-appearing.  HENT:     Head: Normocephalic and atraumatic.     Mouth/Throat:     Mouth: Mucous membranes are moist.  Pulmonary:     Effort: Pulmonary effort is normal. No respiratory distress.  Skin:    General: Skin is warm and dry.     Capillary Refill: Capillary refill takes less than 2 seconds.     Coloration: Skin is not jaundiced or pale.     Findings: Abscess present. No rash.     Comments: Indurated abscess appreciated to left intergluteal fold and tender to touch.  No redness, swelling, warmth, active drainage.  Neurological:     Mental Status: He is alert and oriented to person, place, and time.  Psychiatric:        Behavior: Behavior is cooperative.      UC Treatments / Results  Labs (all labs ordered are listed, but only abnormal results are displayed) Labs Reviewed - No data to display  EKG   Radiology No results found.  Procedures Procedures (including critical care time)  Medications Ordered in UC Medications - No data to display  Initial Impression / Assessment and Plan / UC Course  I have reviewed the triage vital signs and the nursing notes.  Pertinent labs & imaging results that were available during my care of the patient were reviewed by me and considered in my medical decision making (see chart for details).   Patient is well-appearing, normotensive, afebrile, not tachycardic, not tachypneic, oxygenating well on room air.  1. Pilonidal cyst Treat with warm compresses, doxycycline Return for persistent/worsening symptoms despite treatment Also discussed she may follow-up with [facility] [place] [facility] and contact information given  The patient was given the opportunity to ask questions.  All questions answered to their satisfaction.  The patient  is in agreement to this plan.    Final Clinical Impressions(s) / UC Diagnoses   Final diagnoses:  Pilonidal cyst  Discharge Instructions      Take the doxycycline as prescribed to treat infection under your skin.  Seek care if symptoms worsen despite treatment and if you develop an abscess so we can drain it.  The meantime, start warm compresses and continue over-the-counter oral analgesics help with pain.    ED Prescriptions     Medication Sig Dispense Auth. Provider   doxycycline (VIBRAMYCIN) 100 MG capsule Take 1 capsule (100 mg total) by mouth 2 (two) times daily for 7 days. 14 capsule Valentino Nose, NP      PDMP not reviewed this encounter.   Valentino Nose, NP 11/04/23 1344

## 2023-11-04 NOTE — ED Triage Notes (Addendum)
Pt c/o tailbone pain for a week. Denies injury or lifting wrong or any swelling or bump/boil like areas. Reports unable to sit or lay down that will cause any weight to be on tailbone due to pain being so bad. Pt tried taking BC powders, muscle realxer and Ibuprofen 800 mg without relief.
